# Patient Record
Sex: Male | Born: 1990 | Race: White | Hispanic: No | Marital: Single | Smoking: Current every day smoker
Health system: Southern US, Community
[De-identification: ages and names within clinical notes are randomized; demographics above are authoritative.]

## PROBLEM LIST (undated history)

## (undated) DIAGNOSIS — F909 Attention-deficit hyperactivity disorder, unspecified type: Secondary | ICD-10-CM

## (undated) DIAGNOSIS — G4700 Insomnia, unspecified: Secondary | ICD-10-CM

## (undated) DIAGNOSIS — R011 Cardiac murmur, unspecified: Secondary | ICD-10-CM

## (undated) DIAGNOSIS — I350 Nonrheumatic aortic (valve) stenosis: Secondary | ICD-10-CM

## (undated) HISTORY — PX: SUPRAVALVULAR AORTIC STENOSIS REPAIR: SHX2475

## (undated) HISTORY — PX: EXPLORATION POST OPERATIVE OPEN HEART: SHX5061

---

## 2016-05-08 ENCOUNTER — Encounter (HOSPITAL_COMMUNITY): Payer: Self-pay | Admitting: *Deleted

## 2016-05-08 ENCOUNTER — Emergency Department (HOSPITAL_COMMUNITY): Payer: BLUE CROSS/BLUE SHIELD

## 2016-05-08 ENCOUNTER — Emergency Department (HOSPITAL_COMMUNITY)
Admission: EM | Admit: 2016-05-08 | Discharge: 2016-05-08 | Disposition: A | Discharge status: Home / Self Care | Payer: BLUE CROSS/BLUE SHIELD | Attending: Emergency Medicine | Admitting: Emergency Medicine

## 2016-05-08 DIAGNOSIS — W109XXA Fall (on) (from) unspecified stairs and steps, initial encounter: Secondary | ICD-10-CM | POA: Insufficient documentation

## 2016-05-08 DIAGNOSIS — F17200 Nicotine dependence, unspecified, uncomplicated: Secondary | ICD-10-CM | POA: Insufficient documentation

## 2016-05-08 DIAGNOSIS — Y929 Unspecified place or not applicable: Secondary | ICD-10-CM | POA: Insufficient documentation

## 2016-05-08 DIAGNOSIS — Y939 Activity, unspecified: Secondary | ICD-10-CM | POA: Diagnosis not present

## 2016-05-08 DIAGNOSIS — S82832A Other fracture of upper and lower end of left fibula, initial encounter for closed fracture: Secondary | ICD-10-CM | POA: Diagnosis not present

## 2016-05-08 DIAGNOSIS — F909 Attention-deficit hyperactivity disorder, unspecified type: Secondary | ICD-10-CM | POA: Diagnosis not present

## 2016-05-08 DIAGNOSIS — Y999 Unspecified external cause status: Secondary | ICD-10-CM | POA: Diagnosis not present

## 2016-05-08 DIAGNOSIS — S8992XA Unspecified injury of left lower leg, initial encounter: Secondary | ICD-10-CM | POA: Diagnosis present

## 2016-05-08 DIAGNOSIS — S82302A Unspecified fracture of lower end of left tibia, initial encounter for closed fracture: Secondary | ICD-10-CM | POA: Diagnosis not present

## 2016-05-08 DIAGNOSIS — W19XXXA Unspecified fall, initial encounter: Secondary | ICD-10-CM

## 2016-05-08 DIAGNOSIS — R52 Pain, unspecified: Secondary | ICD-10-CM

## 2016-05-08 HISTORY — DX: Cardiac murmur, unspecified: R01.1

## 2016-05-08 HISTORY — DX: Insomnia, unspecified: G47.00

## 2016-05-08 HISTORY — DX: Attention-deficit hyperactivity disorder, unspecified type: F90.9

## 2016-05-08 HISTORY — DX: Nonrheumatic aortic (valve) stenosis: I35.0

## 2016-05-08 MED ORDER — OXYCODONE-ACETAMINOPHEN 5-325 MG PO TABS
1.0000 | ORAL_TABLET | Freq: Once | ORAL | Status: AC
  Administered 2016-05-08: 1 via ORAL
  Filled 2016-05-08: qty 1

## 2016-05-08 MED ORDER — OXYCODONE-ACETAMINOPHEN 5-325 MG PO TABS: 1.0000 | ORAL_TABLET | Freq: Four times a day (QID) | ORAL | 0 refills | Status: AC | PRN

## 2016-05-08 MED ORDER — IBUPROFEN 800 MG PO TABS: 800.0000 mg | ORAL_TABLET | Freq: Three times a day (TID) | ORAL | 0 refills | Status: AC

## 2016-05-08 NOTE — ED Notes (Signed)
Paged ortho 

## 2016-05-08 NOTE — ED Notes (Signed)
Patient verbalized understanding of discharge instructions and denies any further needs or questions at this time. VS stable. Patient escorted to ED entrance in wheelchair.   

## 2016-05-08 NOTE — Discharge Instructions (Signed)
You have a broken left knee and left ankle.  Please do not bear weight, use crutches, keep leg elevated when possible and follow up with orthopedist next week for further care.

## 2016-05-08 NOTE — ED Triage Notes (Signed)
Pt states he slipped down some stairs causing him to twist his left leg. Pt reports left ankle pain and left leg pain. Swelling noted to left ankle, pt unable to bear weight, pain radiates up to left hip. Pt did not hit head, no LOC

## 2016-05-08 NOTE — ED Provider Notes (Signed)
[facility]-EMERGENCY DEPT Provider Note   CSN: [id number] Arrival date & time: 05/08/16  1753  First Provider Contact:   First MD Initiated Contact with Patient 05/08/16 1859      By signing my name below, I, Nelwyn SalisburyJoshua Fowler, attest that this documentation has been prepared under the direction and in the presence of non-physician practitioner, Fayrene HelperBowie Reva Pinkley, PA-C.  Electronically Signed: Nelwyn SalisburyJoshua Fowler, Scribe. 05/08/2016. 6:17 PM.   History   Chief Complaint Chief Complaint  Patient presents with  . Ankle Pain  . Leg Injury   The history is provided by the patient. No language interpreter was used.   HPI Comments:  Roger GravelRobert Jacobs is a 25 y.o. male who presents to the Emergency Department complaining of radiating constant left leg pain beginning earlier this morning s/p fall. He describes the pain as 10/10. Pt reports slipping off a wet patio, twisting his left foot behind his right foot when he fell and hearing pop through his left leg. Pt denies LOC or head injury. Pt reports associated tingling in left lower leg, left hip pain and left knee pain. He [place]    Past Medical History:  Diagnosis Date  . ADHD (attention deficit hyperactivity disorder)   . Aortic stenosis   . Insomnia   . Murmur     There are no active problems to display for this patient.   Past Surgical History:  Procedure Laterality Date  . EXPLORATION POST OPERATIVE OPEN HEART    . SUPRAVALVULAR AORTIC STENOSIS REPAIR         Home Medications    Prior to Admission medications   Not on File    Family History History reviewed. No pertinent family history.  Social History Social History  Substance Use Topics  . Smoking status: Current Every Day Smoker  . Smokeless tobacco: Never Used  . Alcohol use No     Allergies   Benadryl [diphenhydramine hcl (sleep)]   Review of Systems Review of Systems  Musculoskeletal: Positive for arthralgias and  myalgias.  Neurological: Negative for syncope and headaches.       + Tingling     Physical Exam Updated Vital Signs BP 117/80 (BP Location: Right Arm)   Pulse 116   Temp 97.8 F (36.6 C) (Oral)   Resp 16   Ht 5\' 10"  (1.778 m)   Wt 230 lb (104.3 kg)   SpO2 100%   BMI 33.00 kg/m   Physical Exam  Constitutional: He is oriented to person, place, and time. He appears well-developed and well-nourished. No distress.  HENT:  Head: Normocephalic and atraumatic.  Eyes: Conjunctivae are normal.  Cardiovascular: Normal rate.   Pulmonary/Chest: Effort normal.  Abdominal: He exhibits no distension.  Musculoskeletal: He exhibits edema and tenderness.  Mild tenderness to lateral aspect of left hip without any deformity. Left knee crepitus appreciated to anteriolateral aspect with surrounding swelling and no skin tinting. Tenderness noted to left posterior fossa on palpation. Left ankle is moderately edematous with tenderness to the medial and lateral malleolar region with crepitus and no skin tinting. Decreased ROM secondary to pain. Tenderness noted to fifth metatarsal region of left foot. Dorsalis pedis pulse is palpable, with brisk cap refill. Sensation intact.   Neurological: He is alert and oriented to person, place, and time.  Skin: Skin is warm and dry.  Psychiatric: He has a normal mood and affect.  Nursing note and vitals reviewed.    ED Treatments /  Results  DIAGNOSTIC STUDIES:  Oxygen Saturation is 100% on RA, normal by my interpretation.    COORDINATION OF CARE:  7:10 PM Discussed treatment plan with pt at bedside and pt agreed to plan.  Labs (all labs ordered are listed, but only abnormal results are displayed) Labs Reviewed - No data to display  EKG  EKG Interpretation None       Radiology Dg Tibia/fibula Left  Result Date: 05/08/2016 CLINICAL DATA:  Fall down stairs with left leg pain, initial encounter EXAM: LEFT TIBIA AND FIBULA - 2 VIEW COMPARISON:  None  FINDINGS: There is an oblique fracture through the proximal fibula with only mild displacement. Fracture through the medial malleolus is noted as well. No distal fibular fracture is seen. No soft tissue abnormality is noted. IMPRESSION: Proximal fibular and distal tibial fractures Electronically Signed   By: Alcide Clever M.D.   On: 05/08/2016 19:07   Dg Ankle Complete Left  Result Date: 05/08/2016 CLINICAL DATA:  Fall down stairs with left ankle pain, initial encounter EXAM: LEFT ANKLE COMPLETE - 3+ VIEW COMPARISON:  None. FINDINGS: There is a medial malleolar fracture identified. Lateral and medial soft tissue swelling is seen. No other fracture is noted. IMPRESSION: Medial malleolar fracture Electronically Signed   By: Alcide Clever M.D.   On: 05/08/2016 19:08   Dg Knee Complete 4 Views Left  Result Date: 05/08/2016 CLINICAL DATA:  Fall down stairs with left lower leg pain, initial encounter EXAM: LEFT KNEE - COMPLETE 4+ VIEW COMPARISON:  None. FINDINGS: Incompletely evaluated on this exam is a midshaft left fibular fracture. No distal femoral fracture is seen. No joint effusion is noted. IMPRESSION: Fibular fracture which is incompletely evaluated on this exam. Electronically Signed   By: Alcide Clever M.D.   On: 05/08/2016 19:06   Dg Hip Unilat With Pelvis 2-3 Views Left  Result Date: 05/08/2016 CLINICAL DATA:  Fall down stairs with left hip pain, initial encounter EXAM: DG HIP (WITH OR WITHOUT PELVIS) 2-3V LEFT COMPARISON:  None. FINDINGS: Pelvic ring is intact. No acute fracture or dislocation is seen. No gross soft tissue abnormality is noted. IMPRESSION: No acute abnormality noted. Electronically Signed   By: Alcide Clever M.D.   On: 05/08/2016 19:08    Procedures Procedures (including critical care time)  Medications Ordered in ED Medications  oxyCODONE-acetaminophen (PERCOCET/ROXICET) 5-325 MG per tablet 1 tablet (not administered)     Initial Impression / Assessment and Plan / ED  Course  I have reviewed the triage vital signs and the nursing notes.  Pertinent labs & imaging results that were available during my care of the patient were reviewed by me and considered in my medical decision making (see chart for details).  Clinical Course   BP 117/80 (BP Location: Right Arm)   Pulse 116   Temp 97.8 F (36.6 C) (Oral)   Resp 16   Ht  (1.778 m)   Wt 104.3 kg   SpO2 100%   BMI 33.00 kg/m   Patient X-Ray positive for obvious fractures in left lower extremity.  Pt suffered a proximal fibular and distal tibial fracture involving the L leg. Pt is NVI.  This is a closed injury.  Care discussed with Dr. Adriana Simas, who recommend splinting and crutches and Pt advised to follow up with orthopedics. Patient given splint and crutches while in ED, conservative therapy recommended and discussed. Patient will be discharged home & is agreeable with above plan. Returns precautions discussed. Pt appears safe for  discharge.  Will have radiology to make CDs of the imaging today for personal use.    Final Clinical Impressions(s) / ED Diagnoses   Final diagnoses:  Fracture of left proximal fibula, closed, initial encounter  Closed fracture of left distal tibia, initial encounter    New Prescriptions New Prescriptions   IBUPROFEN (ADVIL,MOTRIN) 800 MG TABLET    Take 1 tablet (800 mg total) by mouth 3 (three) times daily.   OXYCODONE-ACETAMINOPHEN (PERCOCET/ROXICET) 5-325 MG TABLET    Take 1 tablet by mouth every 6 (six) hours as needed for moderate pain or severe pain.  I personally performed the services described in this documentation, which was scribed in my presence. The recorded information has been reviewed and is accurate.      Fayrene Helper, PA-C 05/08/16 1945    Donnetta Hutching, MD 05/09/16 [phone]

## 2016-05-08 NOTE — ED Notes (Signed)
PA-C to see and assess patient before RN assessment. See PA note. 

## 2016-05-08 NOTE — Progress Notes (Signed)
Orthopedic Tech Progress Note Patient Details:  Jani GravelRobert Deshazo May 20, 1991 [id number]  Ortho Devices Type of Ortho Device: Crutches, Post (long leg) splint Ortho Device/Splint Location: lle Ortho Device/Splint Interventions: Ordered, Application   Trinna PostMartinez, Ceniya Fowers J 05/08/2016, 7:49 PM

## 2018-01-20 IMAGING — DX DG TIBIA/FIBULA 2V*L*
3 series · 3 of 3 positions shown · non-contrast
Comparison: None

CLINICAL DATA: Fall down stairs with left leg pain, initial
encounter

EXAM:
LEFT TIBIA AND FIBULA - 2 VIEW

[ankle ap]
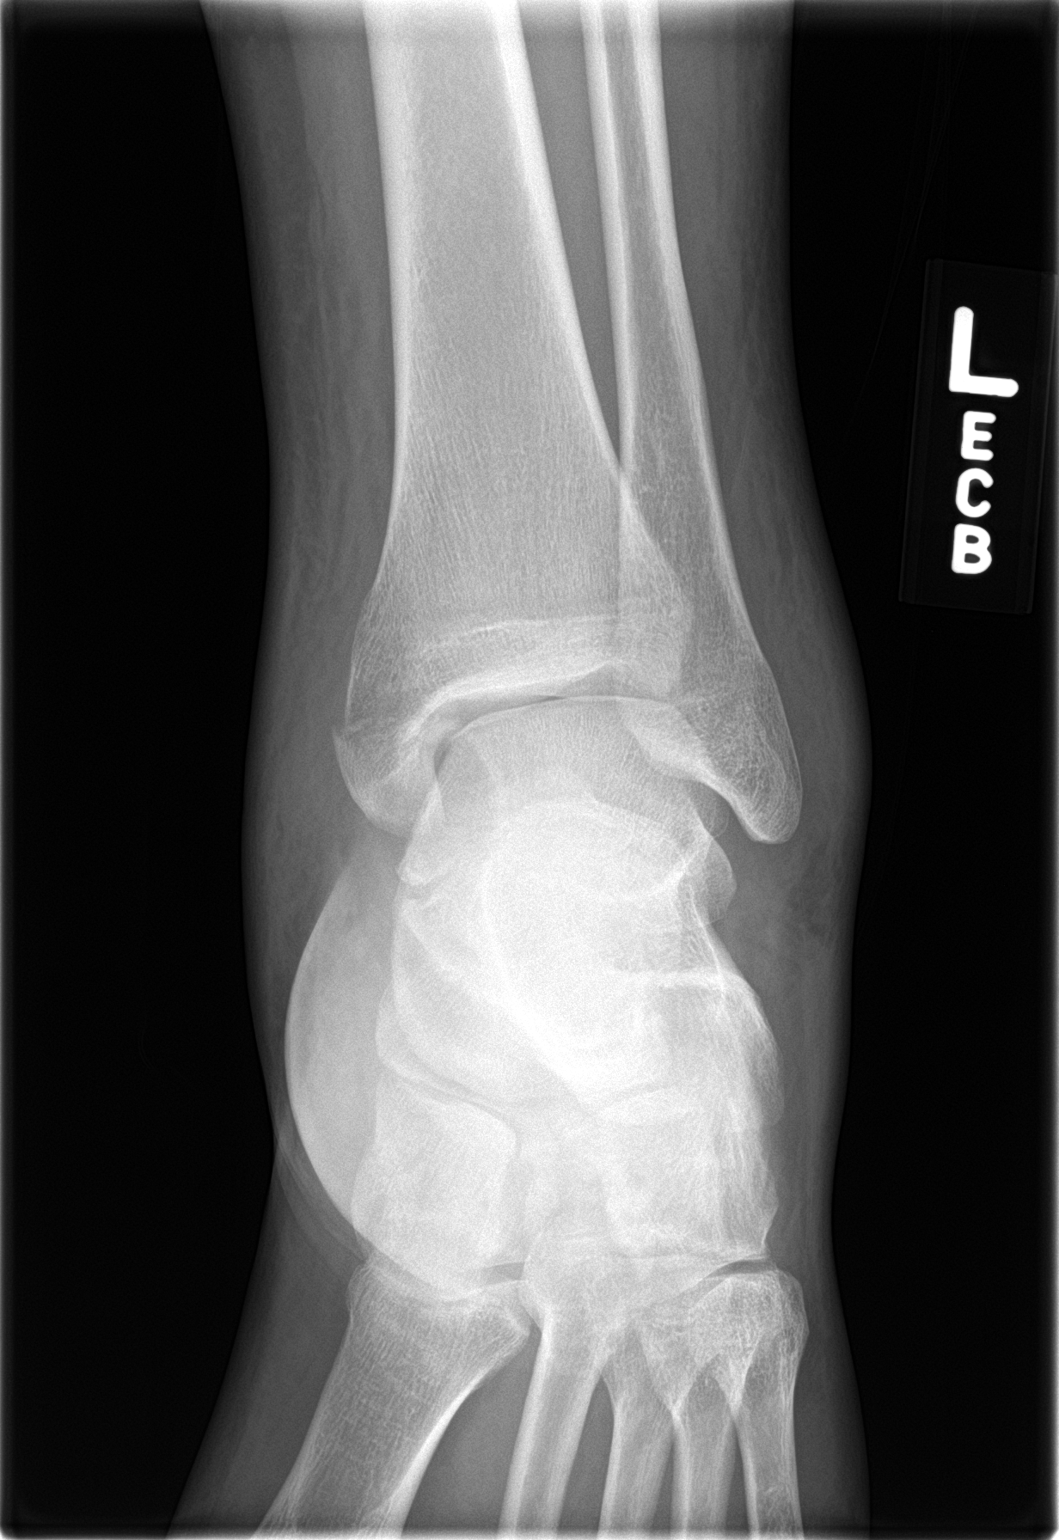

[ankle obl]
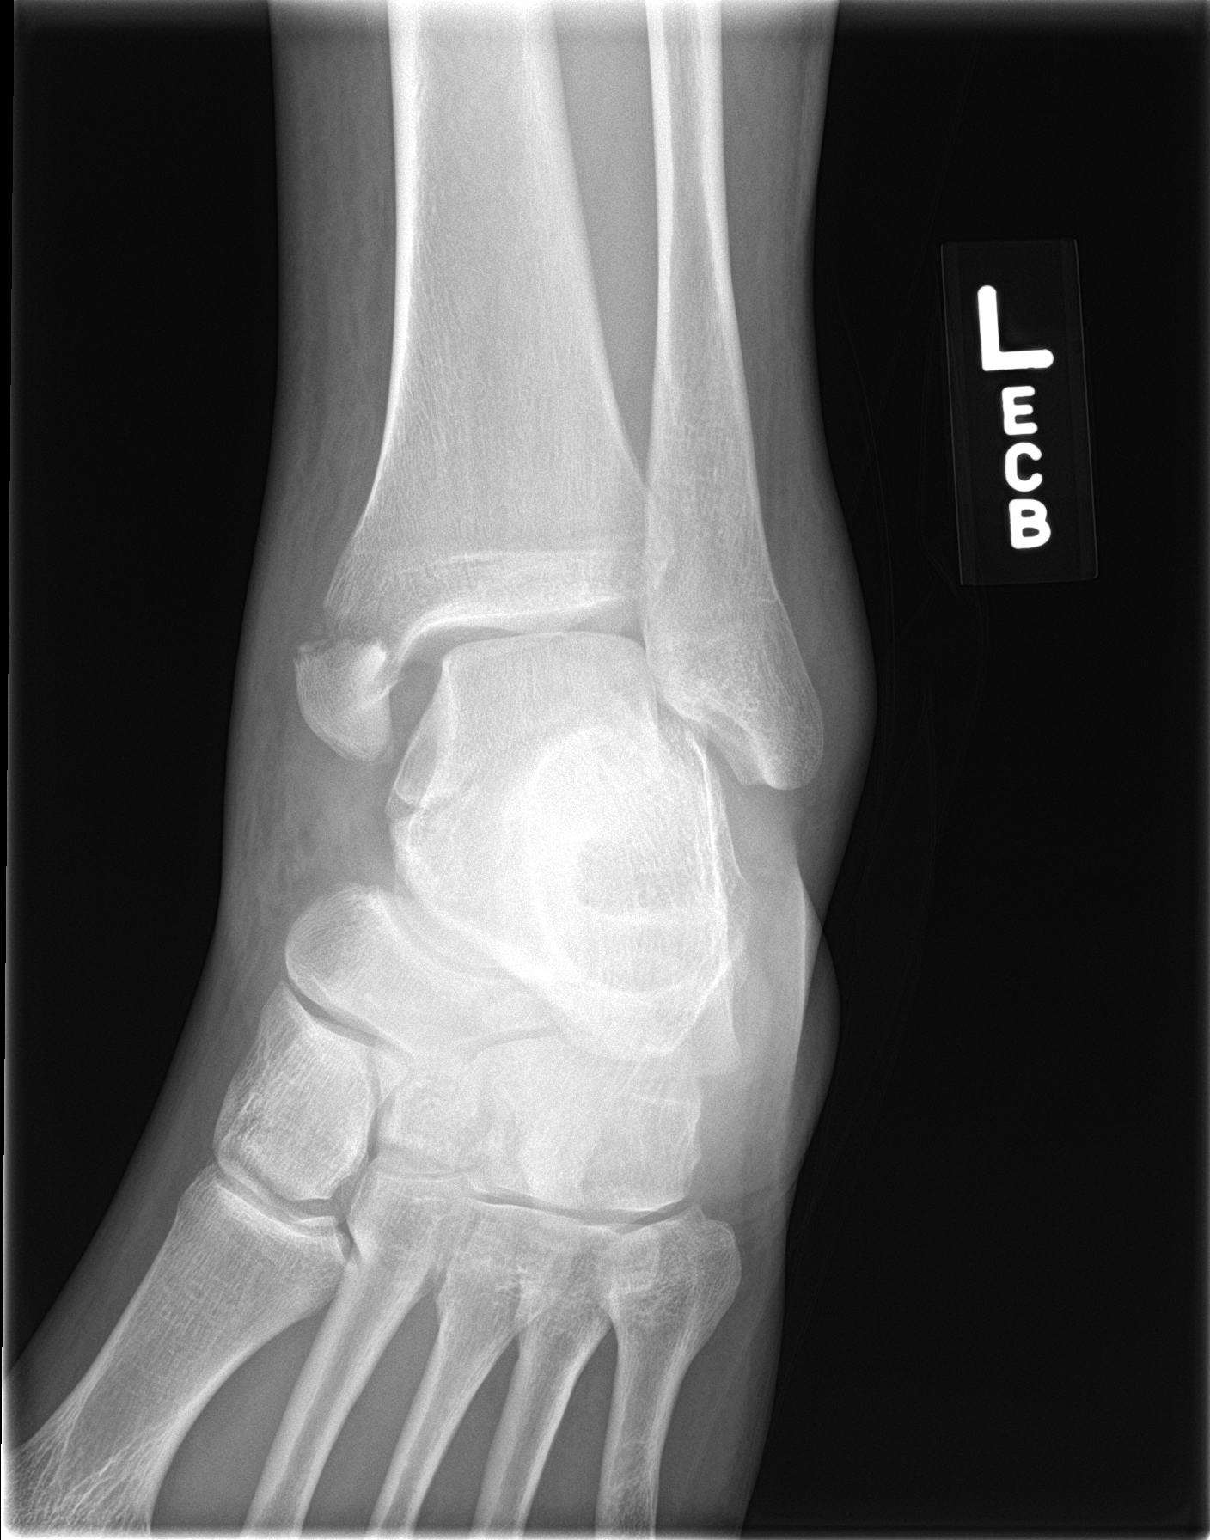

[ankle lat]
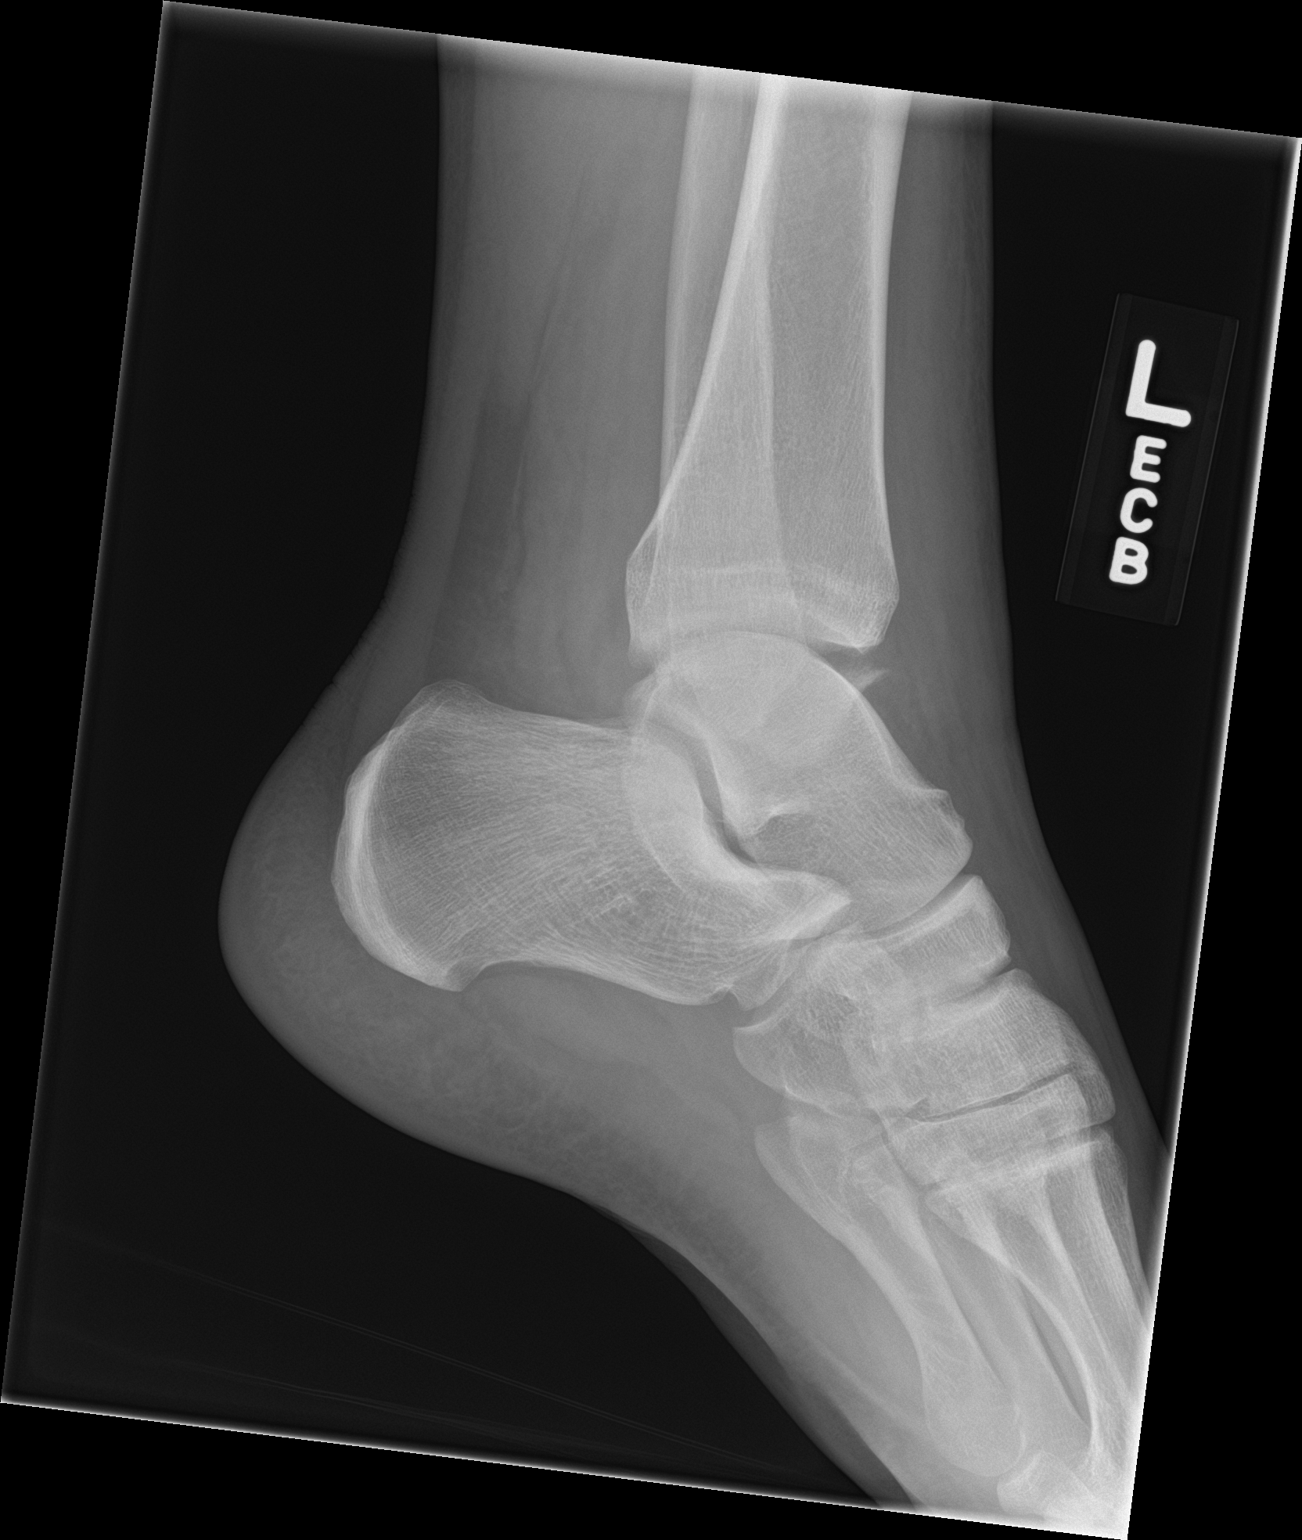

[3 of 3 positions shown; findings below may reference images not displayed]

FINDINGS: There is an oblique fracture through the proximal fibula with only
mild displacement. Fracture through the medial malleolus is noted as
well. No distal fibular fracture is seen. No soft tissue abnormality
is noted.
IMPRESSION: Proximal fibular and distal tibial fractures

## 2018-01-20 IMAGING — DX DG HIP (WITH OR WITHOUT PELVIS) 2-3V*L*
3 series · 3 of 3 positions shown · non-contrast
Comparison: None.

CLINICAL DATA: Fall down stairs with left hip pain, initial
encounter

EXAM:
DG HIP (WITH OR WITHOUT PELVIS) 2-3V LEFT

[pelvis ap]
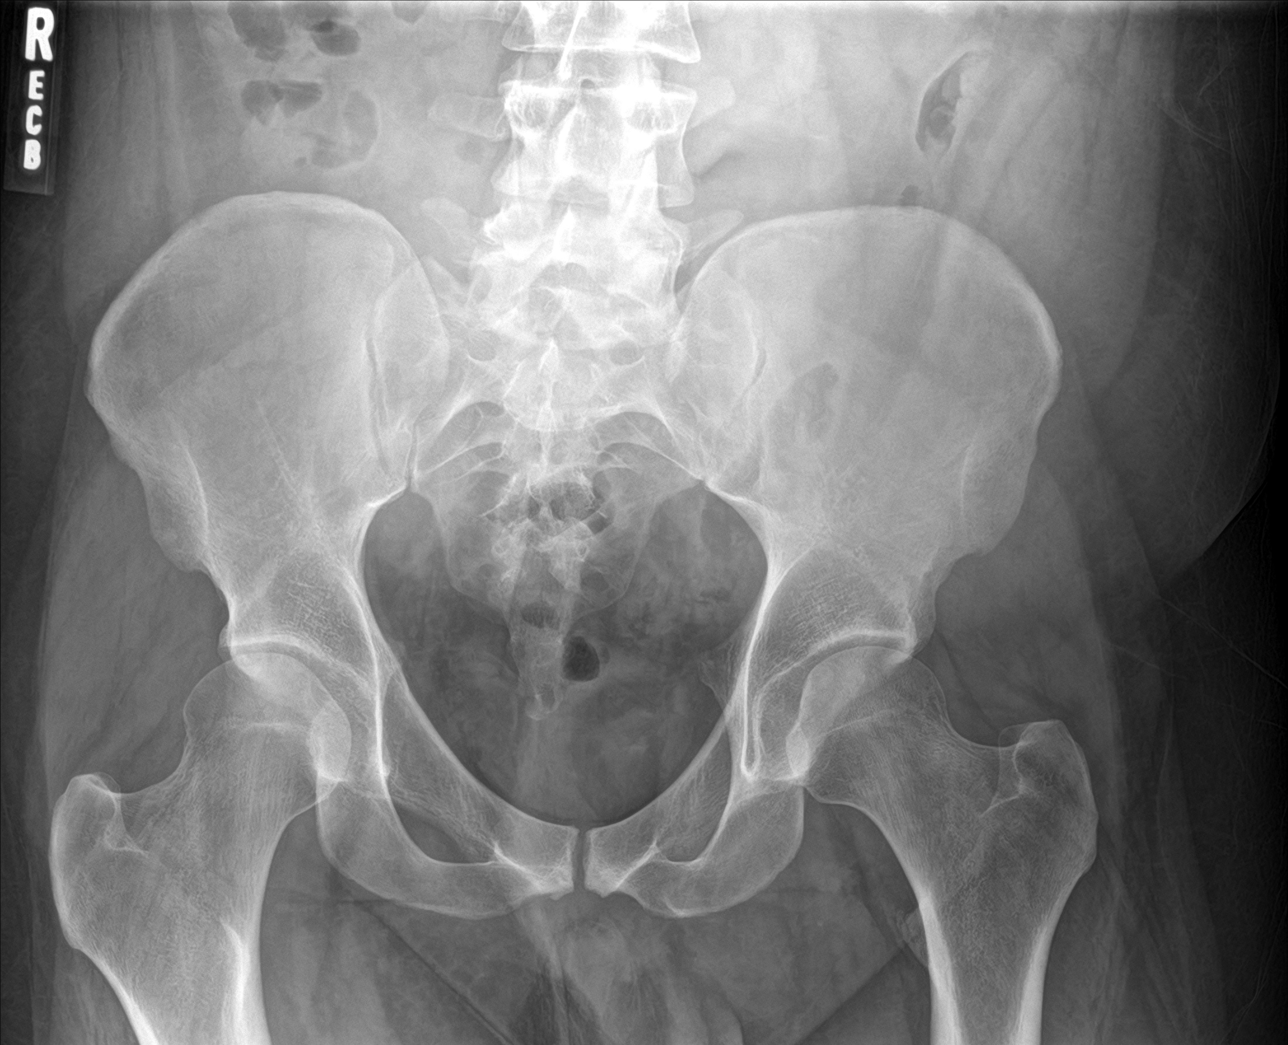

[hip ap]
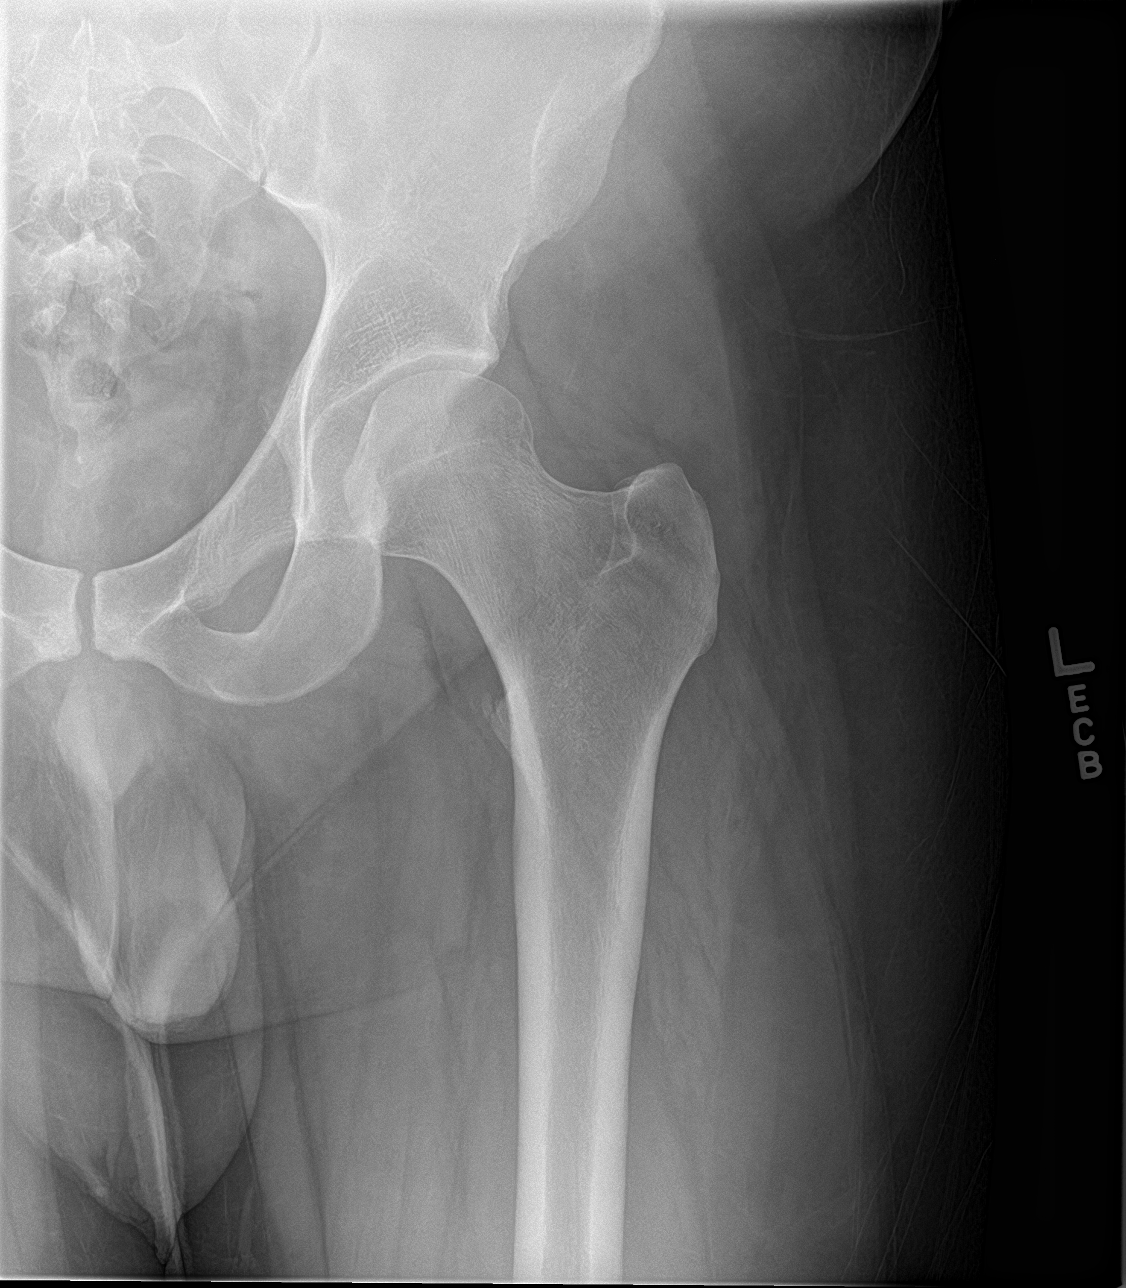

[hip lat]
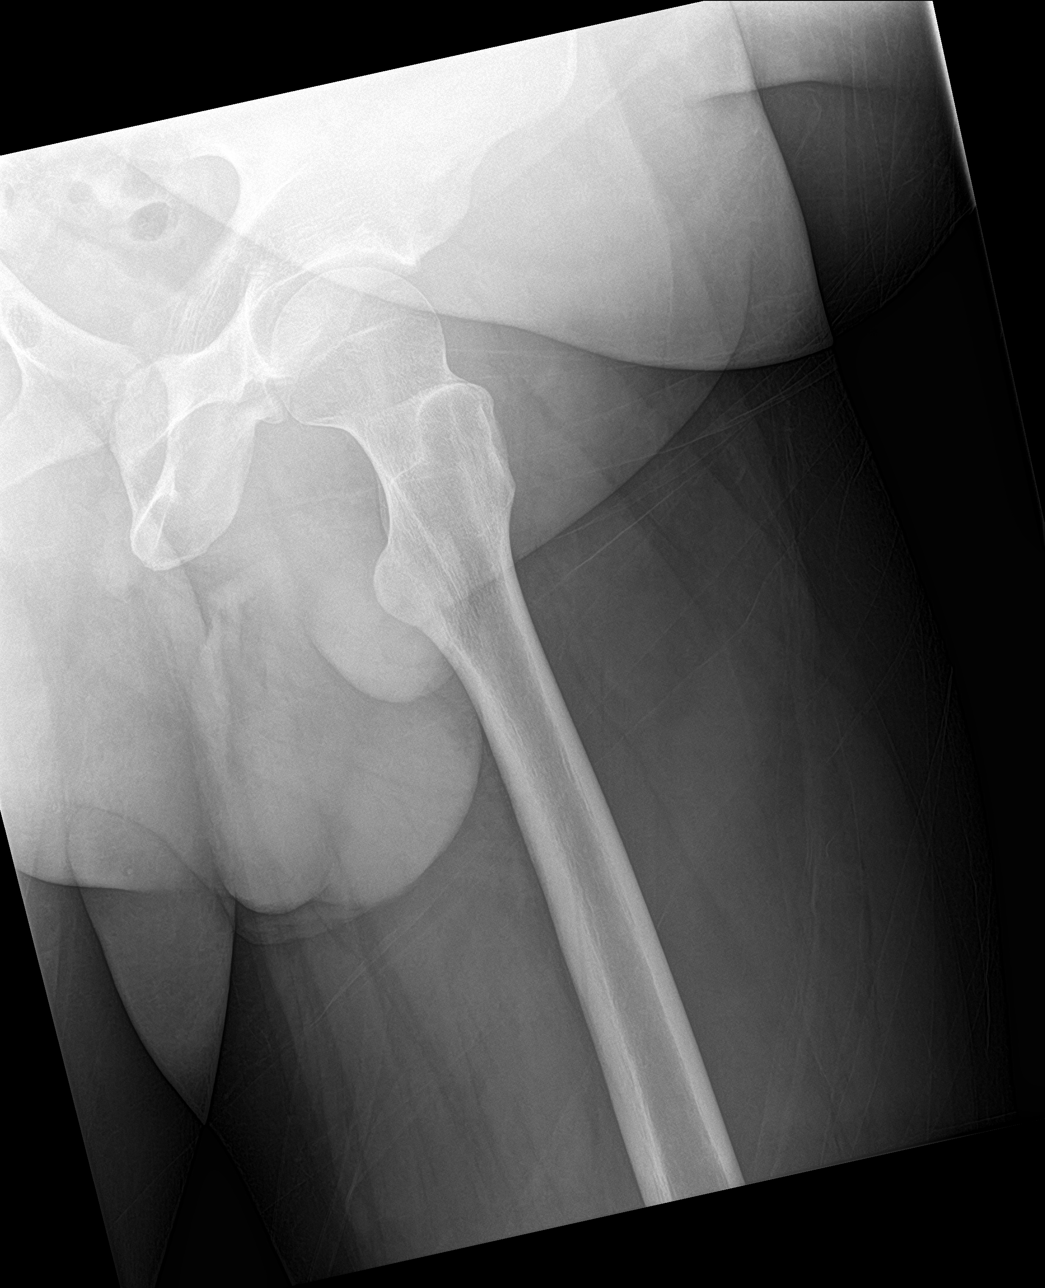

[3 of 3 positions shown; findings below may reference images not displayed]

FINDINGS: Pelvic ring is intact. No acute fracture or dislocation is seen. No
gross soft tissue abnormality is noted.
IMPRESSION: No acute abnormality noted.
